# Patient Record
Sex: Female | Born: 1985 | Race: White | Hispanic: No | Marital: Married | Smoking: Never smoker
Health system: Southern US, Community
[De-identification: ages and names within clinical notes are randomized; demographics above are authoritative.]

## PROBLEM LIST (undated history)

## (undated) DIAGNOSIS — G4700 Insomnia, unspecified: Secondary | ICD-10-CM

---
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
## 2005-05-08 ENCOUNTER — Other Ambulatory Visit: Admission: RE | Admit: 2005-05-08 | Discharge: 2005-05-08 | Payer: Self-pay | Admitting: *Deleted

## 2005-06-18 ENCOUNTER — Encounter: Admission: RE | Admit: 2005-06-18 | Discharge: 2005-06-18 | Payer: Self-pay | Admitting: Family Medicine

## 2006-08-05 ENCOUNTER — Other Ambulatory Visit: Admission: RE | Admit: 2006-08-05 | Discharge: 2006-08-05 | Payer: Self-pay | Admitting: *Deleted

## 2007-09-03 ENCOUNTER — Inpatient Hospital Stay (HOSPITAL_COMMUNITY): Admission: RE | Admit: 2007-09-03 | Discharge: 2007-09-06 | Payer: Self-pay | Admitting: Obstetrics and Gynecology

## 2009-03-21 ENCOUNTER — Ambulatory Visit: Payer: Self-pay | Admitting: Psychiatry

## 2009-03-21 ENCOUNTER — Other Ambulatory Visit: Payer: Self-pay

## 2009-03-21 ENCOUNTER — Other Ambulatory Visit: Payer: Self-pay | Admitting: Emergency Medicine

## 2009-03-21 ENCOUNTER — Inpatient Hospital Stay (HOSPITAL_COMMUNITY): Admission: RE | Admit: 2009-03-21 | Discharge: 2009-03-24 | Payer: Self-pay | Admitting: Psychiatry

## 2009-07-19 ENCOUNTER — Emergency Department (HOSPITAL_BASED_OUTPATIENT_CLINIC_OR_DEPARTMENT_OTHER): Admission: EM | Admit: 2009-07-19 | Discharge: 2009-07-19 | Payer: Self-pay | Admitting: Emergency Medicine

## 2009-11-29 ENCOUNTER — Emergency Department (HOSPITAL_COMMUNITY): Admission: EM | Admit: 2009-11-29 | Discharge: 2009-11-29 | Payer: Self-pay | Admitting: Emergency Medicine

## 2010-10-09 LAB — URINE MICROSCOPIC-ADD ON

## 2010-10-09 LAB — BASIC METABOLIC PANEL
BUN: 9 mg/dL (ref 6–23)
CO2: 27 mEq/L (ref 19–32)
Calcium: 9.4 mg/dL (ref 8.4–10.5)
Chloride: 105 mEq/L (ref 96–112)
Creatinine, Ser: 0.74 mg/dL (ref 0.4–1.2)
GFR calc Af Amer: >60 mL/min (ref >60)
GFR calc non Af Amer: >60 mL/min (ref >60)
Glucose, Bld: 81 mg/dL (ref 70–99)
Potassium: 4.1 mEq/L (ref 3.5–5.1)
Sodium: 139 mEq/L (ref 135–145)

## 2010-10-09 LAB — URINALYSIS, ROUTINE W REFLEX MICROSCOPIC
Glucose, UA: NEGATIVE mg/dL
Ketones, ur: 40 mg/dL — AB
Nitrite: POSITIVE — AB
Protein, ur: 100 mg/dL — AB
Specific Gravity, Urine: 1.009 (ref 1.005–1.030)
Urobilinogen, UA: 1 mg/dL (ref 0.0–1.0)
pH: 6 (ref 5.0–8.0)

## 2010-10-09 LAB — DIFFERENTIAL
Basophils Absolute: 0.1 10*3/uL (ref 0.0–0.1)
Basophils Relative: 1 % (ref 0–1)
Neutro Abs: 11.8 10*3/uL — ABNORMAL HIGH (ref 1.7–7.7)
Neutrophils Relative %: 82 % — ABNORMAL HIGH (ref 43–77)

## 2010-10-09 LAB — CBC
MCHC: 33.8 g/dL (ref 30.0–36.0)
Platelets: 308 10*3/uL (ref 150–400)
RDW: 13 % (ref 11.5–15.5)

## 2010-10-09 LAB — GC/CHLAMYDIA PROBE AMP, GENITAL
Chlamydia, DNA Probe: NEGATIVE
GC Probe Amp, Genital: NEGATIVE

## 2010-10-22 LAB — URINALYSIS, ROUTINE W REFLEX MICROSCOPIC
Bilirubin Urine: NEGATIVE
Hgb urine dipstick: NEGATIVE
Nitrite: NEGATIVE
Specific Gravity, Urine: 1.015 (ref 1.005–1.030)
pH: 6.5 (ref 5.0–8.0)

## 2010-10-22 LAB — POCT TOXICOLOGY PANEL

## 2010-10-22 LAB — PREGNANCY, URINE: Preg Test, Ur: NEGATIVE

## 2010-10-22 LAB — BASIC METABOLIC PANEL
BUN: 11 mg/dL (ref 6–23)
Chloride: 104 mEq/L (ref 96–112)
Glucose, Bld: 105 mg/dL — ABNORMAL HIGH (ref 70–99)
Potassium: 4.3 mEq/L (ref 3.5–5.1)
Sodium: 143 mEq/L (ref 135–145)

## 2010-10-27 LAB — POCT I-STAT, CHEM 8
BUN: 12 mg/dL (ref 6–23)
Creatinine, Ser: 0.8 mg/dL (ref 0.4–1.2)
Glucose, Bld: 84 mg/dL (ref 70–99)
Hemoglobin: 13.9 g/dL (ref 12.0–15.0)
Sodium: 140 mEq/L (ref 135–145)
TCO2: 22 mmol/L (ref 0–100)

## 2010-10-27 LAB — DIFFERENTIAL
Eosinophils Absolute: 0 10*3/uL (ref 0.0–0.7)
Eosinophils Relative: 0 % (ref 0–5)
Lymphocytes Relative: 5 % — ABNORMAL LOW (ref 12–46)
Lymphs Abs: 0.5 10*3/uL — ABNORMAL LOW (ref 0.7–4.0)
Monocytes Relative: 3 % (ref 3–12)

## 2010-10-27 LAB — URINALYSIS, ROUTINE W REFLEX MICROSCOPIC
Glucose, UA: NEGATIVE mg/dL
Hgb urine dipstick: NEGATIVE
Protein, ur: NEGATIVE mg/dL
pH: 6.5 (ref 5.0–8.0)

## 2010-10-27 LAB — CBC
HCT: 39.3 % (ref 36.0–46.0)
Hemoglobin: 13.4 g/dL (ref 12.0–15.0)
MCV: 92.5 fL (ref 78.0–100.0)
RBC: 4.25 MIL/uL (ref 3.87–5.11)
WBC: 9.7 10*3/uL (ref 4.0–10.5)

## 2010-10-27 LAB — URINE MICROSCOPIC-ADD ON

## 2010-10-27 LAB — RAPID URINE DRUG SCREEN, HOSP PERFORMED
Barbiturates: NOT DETECTED
Benzodiazepines: POSITIVE — AB

## 2010-10-27 LAB — ETHANOL: Alcohol, Ethyl (B): <5 mg/dL (ref 0–10)

## 2010-12-04 NOTE — Op Note (Signed)
[name], [name]             ACCOUNT NO.:  [id number]   MEDICAL RECORD NO.:  [id number]          PATIENT TYPE:  INP   LOCATION:  9103                          FACILITY:  [facility]   PHYSICIAN:  Kendra H. Tenny Craw, MD     DATE OF BIRTH:  [birth year]   DATE OF PROCEDURE:  09/03/2007  DATE OF DISCHARGE:                               OPERATIVE REPORT   PREOPERATIVE DIAGNOSIS:  1. 39 and 5 week intrauterine pregnancy.  2. History of labioplasty with desire for a primary low transverse      cesarean section without trial of labor.   POSTOPERATIVE DIAGNOSIS:  1. 39 and 5 week intrauterine pregnancy.  2. History of labioplasty with desire for a primary low transverse      cesarean section without trial of labor.   PROCEDURE:  Primary low transverse cesarean section via Pfannenstiel's  skin incision.   SURGEON:  Freddrick March. Tenny Craw, M.D.   ASSISTANT:  Luvenia Redden, M.D.   ANESTHESIA:  Spinal.   OPERATIVE FINDINGS:  A vigorous female infant in the vertex presentation  weighing 8 pounds 13 ounces with Apgar scores of 9 and 9.  Normal  ovaries and tubes.  It was noted that the patient did have an arcuate  shaped uterus.   SPECIMENS:  Placenta for disposal.   ESTIMATED BLOOD LOSS:  600 mL.   COMPLICATIONS:  None.   PROCEDURE IN DETAIL:  [name] is a 25 year old G1, P0, who presents  today at 39 weeks 5 days for a primary elective cesarean section for a  history of labioplasty with a desire to not have a vaginal delivery.  The risks, benefits and alternatives of cesarean section were discussed  with the patient prior to today. Following the appropriate informed  consent, the patient was brought to the operating room where spinal  anesthesia was administered and found to be adequate.  She was placed in  the dorsal supine position with a leftward tilt, prepped and draped in  the normal sterile fashion.  The scalpel was used to make a Pfannenstiel  skin incision which was carried down  through the underlying layers of  soft tissue to the fascia.  The fascia was incised in the midline.  The  fascial incision was extended laterally with Mayo scissors.  The  superior aspect of the fascial incision was grasped with Kocher clamps  x2, tented up, and the underlying rectus muscle was dissected off  sharply with the electrocautery unit and the same procedure was repeated  on the inferior aspect of the fascial incision.  The rectus muscles were  then separated in the midline.  The abdominal peritoneum was identified,  tented up, and entered sharply with Metzenbaum scissors.  The incision  in the abdominal peritoneum was then extended inferiorly and superiorly  with good visualization of the bladder.  The bladder blade was inserted.  The vesicouterine peritoneum was identified, tented up, entered sharply  and the incision was extended laterally with Metzenbaum scissors and the  bladder flap was created digitally.  A scalpel was then used to make a  low transverse  incision on the uterus which was extended laterally with  blunt dissection.  The fetal vertex was then identified and brought up  to the uterine incision.  A Kiwi vacuum was then used to assist with  delivery of the infant's head.  This was in place for 10 seconds and  disengaged quickly. The infant's head was delivered followed by the  shoulders and body.  The infant cried vigorously on the operative field.  The infant was bulb suctioned on the operative field.  The cord was  clamped and cut and the infant was passed to the waiting pediatricians.  The placenta was then spontaneously delivered.  The uterus was  exteriorized, cleared of all clot and debris.  Upon inspection of the  uterus, it was noted to have an arcuate shape intrauterine and heart  shaped on the outside. The ovaries and tubes were normal.  The uterine  incision was repaired with #1 chromic in a running locked fashion with a  second imbricating layer.  A  figure-of-eight suture using 2-0 chromic on  an SH needle was used to achieve hemostasis on the right hand aspect of  the uterine incision.  The uterine incision was then found to be  hemostatic.  The uterus was returned to the abdominal cavity.  The  abdominal cavity was cleared of all clot and debris and the uterine  incision was reinspected and again found to be hemostatic.  The  abdominal peritoneum was then reapproximated with 2-0 Vicryl in a  running fashion, the fascia was closed with looped PDS in a running  fashion, and the skin was closed with a 4-0 Keith needle in a  subcuticular fashion and Steri-Strips were placed to reinforce this.  Prior to placement of the subcuticular stitch, a topical anesthetic was  poured into the incision because the patient was starting to develop  return of sensation in this area. Please refer to the anesthesia record  for the name of the topical anesthetic that was administered.      Freddrick March. Tenny Craw, MD  Electronically Signed     KHR/MEDQ  D:  09/03/2007  T:  09/04/2007  Job:  [id number]
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
## 2010-12-07 NOTE — Discharge Summary (Signed)
Kayla Roth, Kayla Roth NO.:  [id number]   MEDICAL RECORD NO.:  [id number]          PATIENT TYPE:  INP   LOCATION:  9103                          FACILITY:  [facility]   PHYSICIAN:  Malva Limes, M.D.    DATE OF BIRTH:  1985-12-30   DATE OF ADMISSION:  09/03/2007  DATE OF DISCHARGE:  09/06/2007                               DISCHARGE SUMMARY   FINAL DIAGNOSIS:  1. Intrauterine gestation at 39-5/7 weeks.  2. History of labioplasty with desire of a primary cesarean section      and declines trial of labor.   PROCEDURE:  Primary low transverse cesarean section.   SURGEON:  Dr. Waynard Reeds.   ASSISTANT:  Dr. Lodema Hong.   COMPLICATIONS:  None.   This 25 year old G1, P0, presents at term for a primary cesarean  section, secondary to a decline of trial of labor.  The patient's  antepartum course had been complicated by just being a single female  with a labioplasty in 2005.  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  Otherwise, the patient's antepartum course had been uncomplicated.  She  did have a negative group B strep culture obtained around 36 weeks.  She  is taken to the operating at this point on a September 03, 2007 by Dr.  Waynard Reeds, where a primary low transverse cesarean section was  performed with the delivery of an 8-pound, 13-ounce female infant with  Apgars of 9 and 9.  Upon delivery, it was noted that the patient did  have an arcuate-shaped uterus, but the procedure went without  complications, and the patient's postoperative course was benign without  any significant fevers.  She was felt ready for discharge on  postoperative day #3.  She was sent home on a regular diet, told to  decrease her activities, told to continue her vitamins, was given  Percocet 1 to 2 every 4 hours as needed for pain, was to follow up in  our office in 4 weeks.  Instructions and precautions were reviewed with  the patient.   LABS ON DISCHARGE:  The patient had a hemoglobin of 11.0, white blood  cell count of 13.1, platelets of 208,000.      Leilani Able, P.A.-C.    ______________________________  Malva Limes, M.D.    MB/MEDQ  D:  09/29/2007  T:  09/30/2007  Job:  [id number]

## 2011-02-21 ENCOUNTER — Other Ambulatory Visit: Payer: Self-pay | Admitting: Obstetrics and Gynecology

## 2011-04-12 LAB — CBC
HCT: 31 — ABNORMAL LOW
HCT: 38.6
Hemoglobin: 11 — ABNORMAL LOW
MCHC: 35
MCHC: 35.5
MCV: 89.8
Platelets: 208
Platelets: 262
RBC: 4.3
RDW: 13.9
WBC: 11.2 — ABNORMAL HIGH

## 2011-04-12 LAB — RPR: RPR Ser Ql: NONREACTIVE

## 2011-10-21 ENCOUNTER — Other Ambulatory Visit (HOSPITAL_COMMUNITY): Payer: Self-pay | Admitting: Obstetrics and Gynecology

## 2011-10-21 DIAGNOSIS — N739 Female pelvic inflammatory disease, unspecified: Secondary | ICD-10-CM

## 2011-10-25 ENCOUNTER — Ambulatory Visit (HOSPITAL_COMMUNITY)
Admission: RE | Admit: 2011-10-25 | Discharge: 2011-10-25 | Disposition: A | Discharge status: Home / Self Care | Payer: PRIVATE HEALTH INSURANCE | Source: Ambulatory Visit | Attending: Obstetrics and Gynecology | Admitting: Obstetrics and Gynecology

## 2011-10-25 DIAGNOSIS — N949 Unspecified condition associated with female genital organs and menstrual cycle: Secondary | ICD-10-CM | POA: Insufficient documentation

## 2011-10-25 DIAGNOSIS — N739 Female pelvic inflammatory disease, unspecified: Secondary | ICD-10-CM | POA: Insufficient documentation

## 2011-10-25 MED ORDER — IOHEXOL 300 MG/ML  SOLN: 20.0000 mL | Freq: Once | INTRAMUSCULAR | Status: AC | PRN

## 2011-11-15 ENCOUNTER — Other Ambulatory Visit (HOSPITAL_COMMUNITY): Payer: Self-pay | Admitting: Obstetrics and Gynecology

## 2011-11-15 DIAGNOSIS — N739 Female pelvic inflammatory disease, unspecified: Secondary | ICD-10-CM

## 2011-11-19 ENCOUNTER — Ambulatory Visit (HOSPITAL_COMMUNITY)
Admission: RE | Admit: 2011-11-19 | Discharge: 2011-11-19 | Disposition: A | Discharge status: Home / Self Care | Payer: PRIVATE HEALTH INSURANCE | Source: Ambulatory Visit | Attending: Obstetrics and Gynecology | Admitting: Obstetrics and Gynecology

## 2011-11-19 DIAGNOSIS — N739 Female pelvic inflammatory disease, unspecified: Secondary | ICD-10-CM

## 2011-11-19 DIAGNOSIS — N979 Female infertility, unspecified: Secondary | ICD-10-CM | POA: Insufficient documentation

## 2011-11-19 MED ORDER — IOHEXOL 300 MG/ML  SOLN: 7.0000 mL | Freq: Once | INTRAMUSCULAR | Status: AC | PRN

## 2011-11-20 ENCOUNTER — Ambulatory Visit (HOSPITAL_COMMUNITY): Admission: RE | Admit: 2011-11-20 | Payer: PRIVATE HEALTH INSURANCE | Source: Ambulatory Visit

## 2011-11-24 ENCOUNTER — Emergency Department (HOSPITAL_BASED_OUTPATIENT_CLINIC_OR_DEPARTMENT_OTHER)
Admission: EM | Admit: 2011-11-24 | Discharge: 2011-11-25 | Disposition: A | Discharge status: Home / Self Care | Payer: PRIVATE HEALTH INSURANCE | Attending: Emergency Medicine | Admitting: Emergency Medicine

## 2011-11-24 ENCOUNTER — Emergency Department (INDEPENDENT_AMBULATORY_CARE_PROVIDER_SITE_OTHER): Payer: PRIVATE HEALTH INSURANCE

## 2011-11-24 ENCOUNTER — Encounter (HOSPITAL_BASED_OUTPATIENT_CLINIC_OR_DEPARTMENT_OTHER): Payer: Self-pay | Admitting: *Deleted

## 2011-11-24 DIAGNOSIS — R51 Headache: Secondary | ICD-10-CM

## 2011-11-24 DIAGNOSIS — R55 Syncope and collapse: Secondary | ICD-10-CM

## 2011-11-24 DIAGNOSIS — J3489 Other specified disorders of nose and nasal sinuses: Secondary | ICD-10-CM

## 2011-11-24 HISTORY — DX: Insomnia, unspecified: G47.00

## 2011-11-24 LAB — PREGNANCY, URINE: Preg Test, Ur: NEGATIVE

## 2011-11-24 LAB — DIFFERENTIAL
Basophils Absolute: 0 K/uL (ref 0.0–0.1)
Basophils Relative: 0 % (ref 0–1)
Eosinophils Absolute: 0.2 10*3/uL (ref 0.0–0.7)
Eosinophils Relative: 4 % (ref 0–5)
Lymphocytes Relative: 47 % — ABNORMAL HIGH (ref 12–46)
Lymphs Abs: 3 10*3/uL (ref 0.7–4.0)
Monocytes Absolute: 0.5 10*3/uL (ref 0.1–1.0)
Monocytes Relative: 8 % (ref 3–12)
Neutro Abs: 2.6 K/uL (ref 1.7–7.7)
Neutrophils Relative %: 41 % — ABNORMAL LOW (ref 43–77)

## 2011-11-24 LAB — CBC
HCT: 36.4 % (ref 36.0–46.0)
Hemoglobin: 12.8 g/dL (ref 12.0–15.0)
MCH: 30.6 pg (ref 26.0–34.0)
MCHC: 35.2 g/dL (ref 30.0–36.0)
MCV: 87.1 fL (ref 78.0–100.0)
Platelets: 287 K/uL (ref 150–400)
RBC: 4.18 MIL/uL (ref 3.87–5.11)
RDW: 12.6 % (ref 11.5–15.5)
WBC: 6.3 K/uL (ref 4.0–10.5)

## 2011-11-24 NOTE — Discharge Instructions (Signed)
Syncope You have had a fainting (syncopal) spell. A fainting episode is a sudden, short-lived loss of consciousness. It results in complete recovery. It occurs because there has been a temporary shortage of oxygen and/or sugar (glucose) to the brain. CAUSES   Blood pressure pills and other medications that may lower blood pressure below normal. Sudden changes in posture (sudden standing).   Over-medication. Take your medications as directed.   Standing too long. This can cause blood to pool in the legs.   Seizure disorders.   Low blood sugar (hypoglycemia) of diabetes. This more commonly causes coma.   Bearing down to go to the bathroom. This can cause your blood pressure to rise suddenly. Your body compensates by making the blood pressure too low when you stop bearing down.   Hardening of the arteries where the brain temporarily does not receive enough blood.   Irregular heart beat and circulatory problems.   Fear, emotional distress, injury, sight of blood, or illness.  Your caregiver will send you home if the syncope was from non-worrisome causes (benign). Depending on your age and health, you may stay to be monitored and observed. If you return home, have someone stay with you if your caregiver feels that is desirable. It is very important to keep all follow-up referrals and appointments in order to properly manage this condition. This is a serious problem which can lead to serious illness and death if not carefully managed.  WARNING: Do not drive or operate machinery until your caregiver feels that it is safe for you to do so. SEEK IMMEDIATE MEDICAL CARE IF:   You have another fainting episode or faint while lying or sitting down. DO NOT DRIVE YOURSELF. Call 911 if no other help is available.   You have chest pain, are feeling sick to your stomach (nausea), vomiting or abdominal pain.   You have an irregular heartbeat or one that is very fast (pulse over 120 beats per minute).    You have a loss of feeling in some part of your body or lose movement in your arms or legs.   You have difficulty with speech, confusion, severe weakness, or visual problems.   You become sweaty and/or feel light headed.  Make sure you are rechecked as instructed. Document Released: 07/08/2005 Document Revised: 06/27/2011 Document Reviewed: 02/26/2007 [employer] Patient Information 2012 [employer]. 

## 2011-11-24 NOTE — ED Notes (Signed)
Pt presents to ED with migraine HA.  Pt has hx of same.  Pt states pain mainly located in neck and across shoulders.

## 2011-11-24 NOTE — ED Notes (Signed)
Pt states that she has been having frequent "migraines" states that last night she had a seizure and" just didn't want to come in " pt states that she only has a mild HA now in the back of her head pt is alert and oriented
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
## 2011-11-25 LAB — RAPID URINE DRUG SCREEN, HOSP PERFORMED
Amphetamines: NOT DETECTED
Barbiturates: NOT DETECTED
Benzodiazepines: NOT DETECTED
Cocaine: NOT DETECTED
Opiates: NOT DETECTED
Tetrahydrocannabinol: NOT DETECTED

## 2011-11-25 LAB — BASIC METABOLIC PANEL WITH GFR
CO2: 25 meq/L (ref 19–32)
Chloride: 106 meq/L (ref 96–112)
Potassium: 3.6 meq/L (ref 3.5–5.1)
Sodium: 141 meq/L (ref 135–145)

## 2011-11-25 LAB — BASIC METABOLIC PANEL
BUN: 11 mg/dL (ref 6–23)
Calcium: 9.5 mg/dL (ref 8.4–10.5)
Creatinine, Ser: 0.7 mg/dL (ref 0.50–1.10)
GFR calc Af Amer: >90 mL/min (ref >90)
GFR calc non Af Amer: >90 mL/min (ref >90)
Glucose, Bld: 122 mg/dL — ABNORMAL HIGH (ref 70–99)

## 2011-11-28 ENCOUNTER — Other Ambulatory Visit (HOSPITAL_COMMUNITY): Payer: Self-pay | Admitting: Obstetrics and Gynecology

## 2011-11-28 DIAGNOSIS — N971 Female infertility of tubal origin: Secondary | ICD-10-CM

## 2011-11-28 DIAGNOSIS — N979 Female infertility, unspecified: Secondary | ICD-10-CM

## 2011-12-06 ENCOUNTER — Ambulatory Visit (HOSPITAL_COMMUNITY)
Admission: RE | Admit: 2011-12-06 | Discharge: 2011-12-06 | Disposition: A | Discharge status: Home / Self Care | Payer: PRIVATE HEALTH INSURANCE | Source: Ambulatory Visit | Attending: Obstetrics and Gynecology | Admitting: Obstetrics and Gynecology

## 2011-12-06 DIAGNOSIS — N971 Female infertility of tubal origin: Secondary | ICD-10-CM | POA: Insufficient documentation

## 2011-12-06 DIAGNOSIS — N979 Female infertility, unspecified: Secondary | ICD-10-CM

## 2011-12-06 MED ORDER — IOHEXOL 300 MG/ML  SOLN
30.0000 mL | Freq: Once | INTRAMUSCULAR | Status: AC | PRN
  Administered 2011-12-06: 30 mL

## 2012-07-24 IMAGING — CT CT HEAD W/O CM
2 series · 16 of 30 positions shown, 18 images · non-contrast
Comparison: 06/18/2005

CLINICAL DATA: Headache.

CT HEAD WITHOUT CONTRAST
TECHNIQUE: Contiguous axial images were obtained from the base of
the skull through the vertex without contrast.

[Series 2: head 4.8 h37s · axial · 0.46mm/px · z∈[-168,-35]mm · 8 of 36 slices shown, 10 images]
[im 4/36  brain]
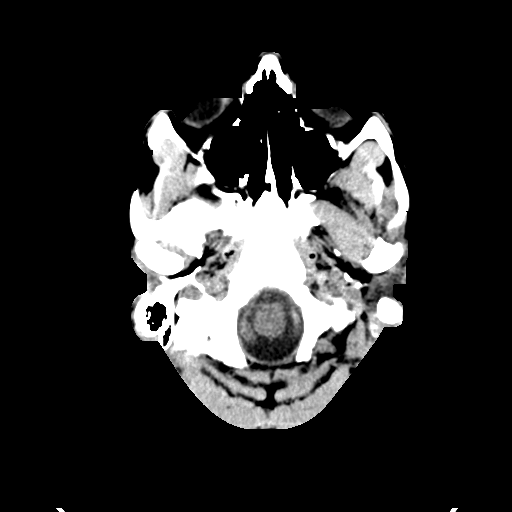
[im 4/36  bone]
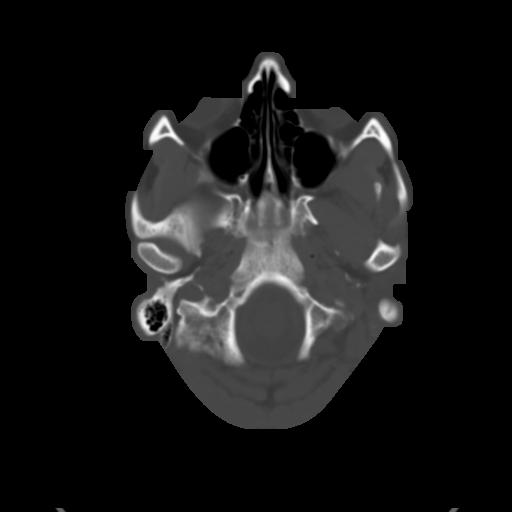
[im 8/36  brain]
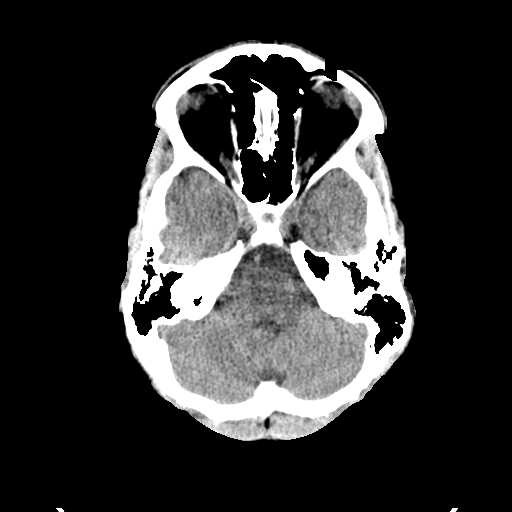
[im 12/36  brain]
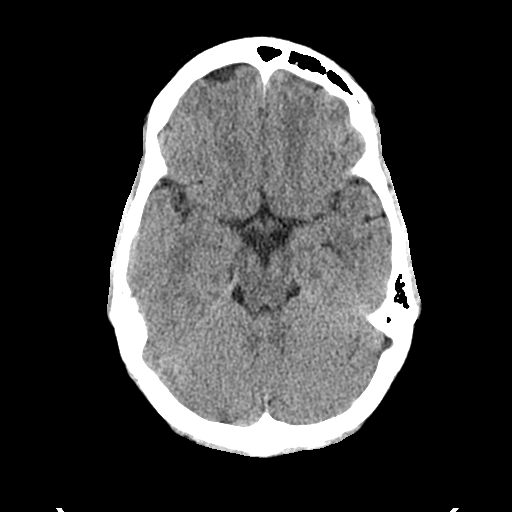
[im 16/36  brain]
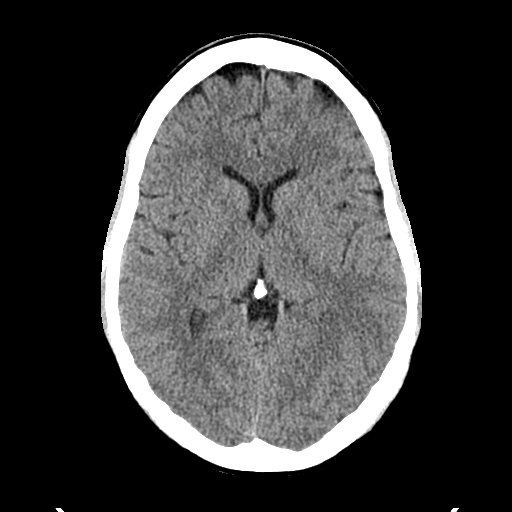
[im 20/36  brain]
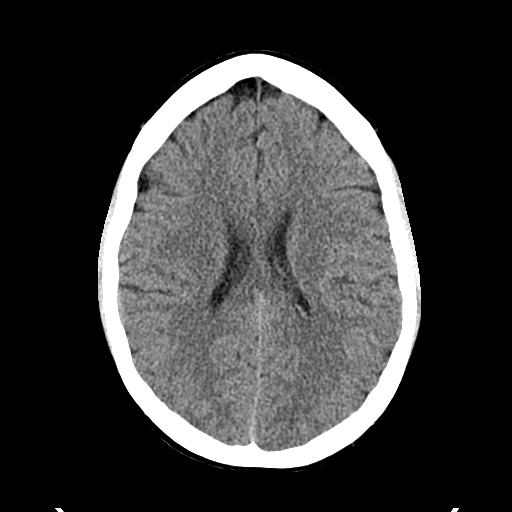
[im 20/36  bone]
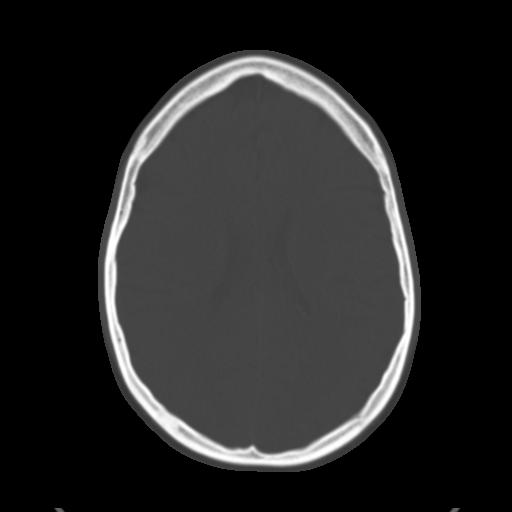
[im 24/36  brain]
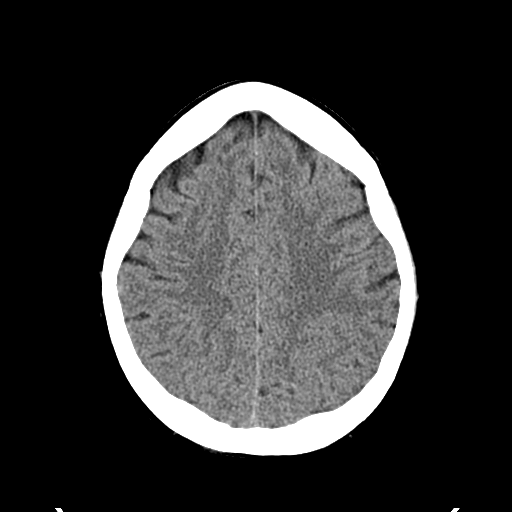
[im 28/36  brain]
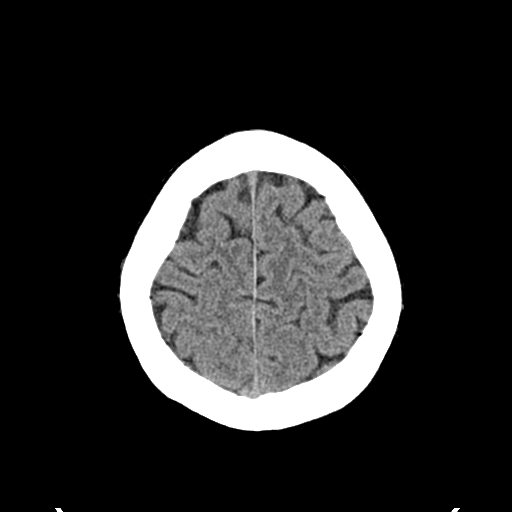
[im 32/36  brain]
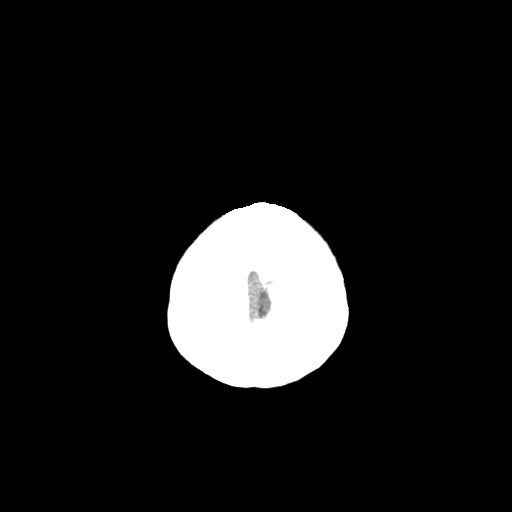

[Series 3: head 2.4 h60s bone · axial · 0.46mm/px · z∈[-166,-33]mm · 8 of 72 slices shown]
[im 8/72  bone]
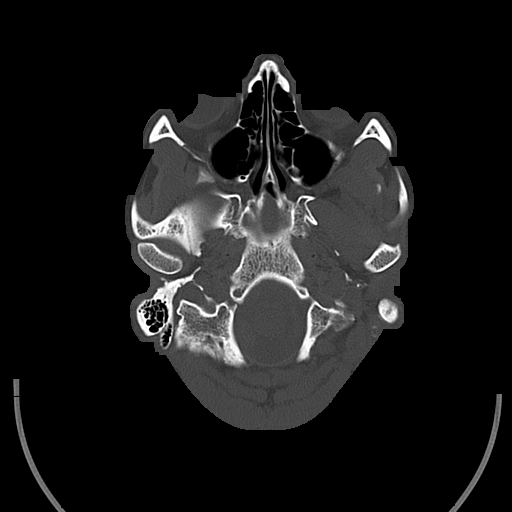
[im 15/72  bone]
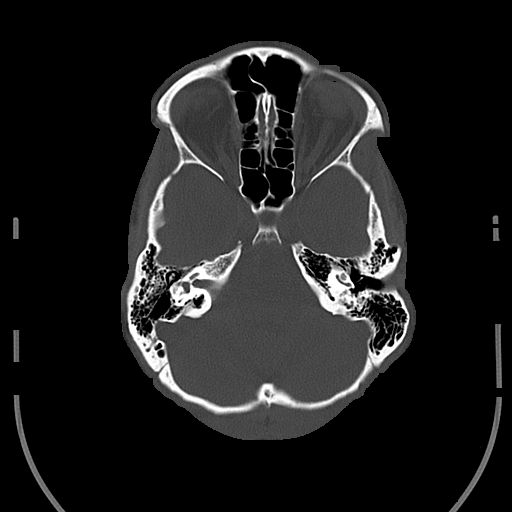
[im 23/72  bone]
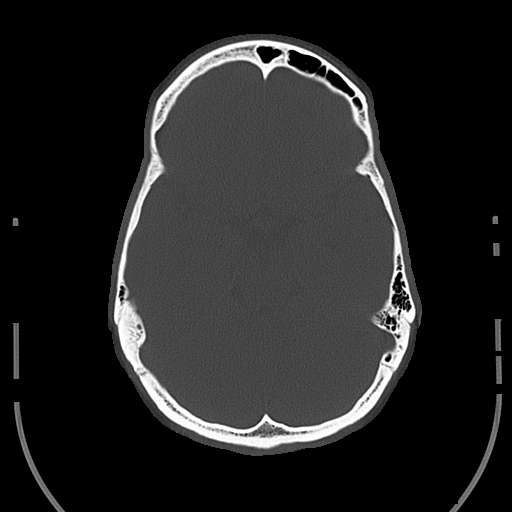
[im 30/72  bone]
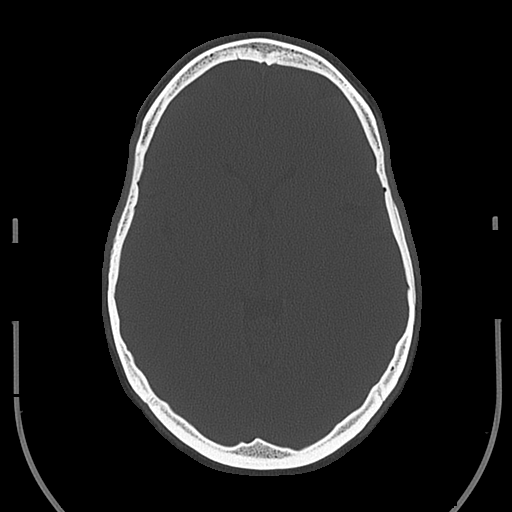
[im 42/72  bone]
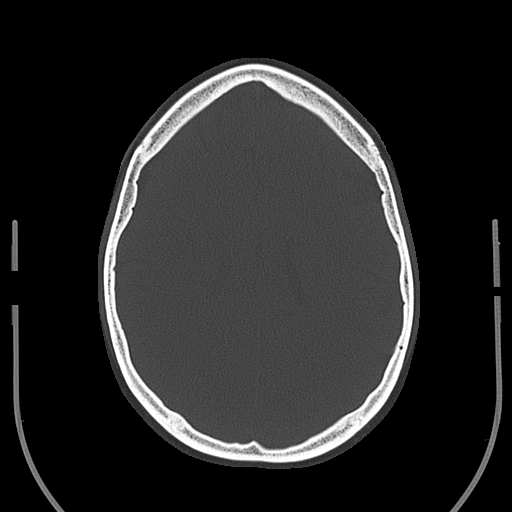
[im 49/72  bone]
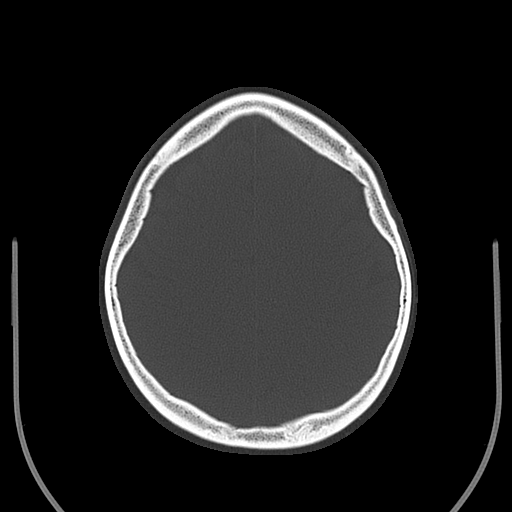
[im 57/72  bone]
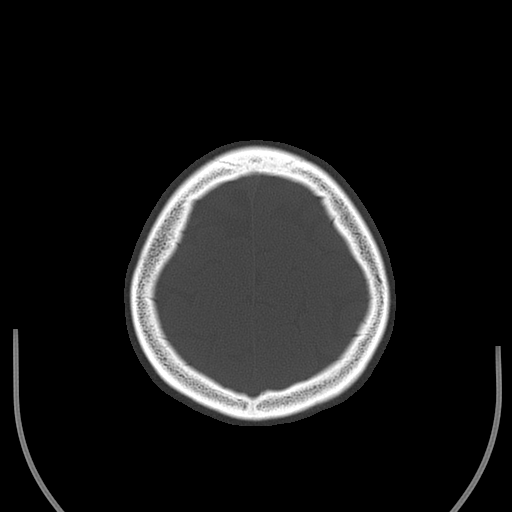
[im 64/72  bone]
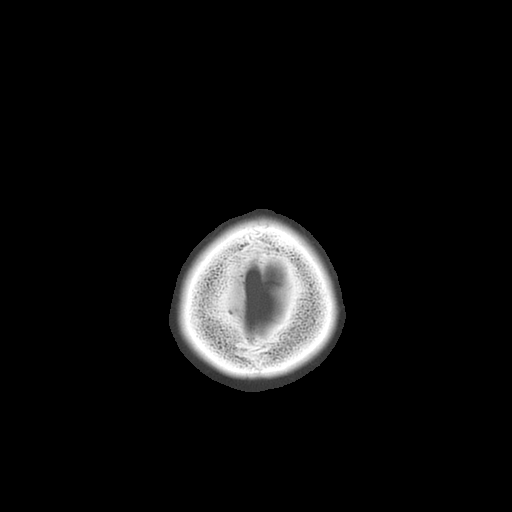

[16 of 30 positions shown; findings below may reference images not displayed]

FINDINGS: There is no evidence for acute hemorrhage, hydrocephalus,
mass lesion, or abnormal extra-axial fluid collection.  No definite
CT evidence for acute infarction.  Mucocele or mucosal polyp in the
left sphenoid sinus has progressed in the interval.  Remaining
visualized paranasal sinuses are clear.
IMPRESSION: No acute intracranial abnormality.

Chronic mucosal disease in the left sphenoid sinus.

## 2012-08-05 IMAGING — RF DG HYSTEROGRAM
9 series · 9 of 9 positions shown · non-contrast
Comparison: none

CLINICAL DATA: Infertility with tubal occlusion documented on
prior HSG.  Bilateral cannulation of the fallopian tubes to be
performed.

HYSTEROSALPINGOGRAM
TECHNIQUE: Hysterosalpingogram was performed by the ordering
physician under fluoroscopy.  Fluoroscopic images are submitted for
interpretation following the procedure.
Fluoroscopy Time:  4.3 minutes.

[Series 1: run · 1 of 1 slices shown (1 of 9)]
[im 1/1]
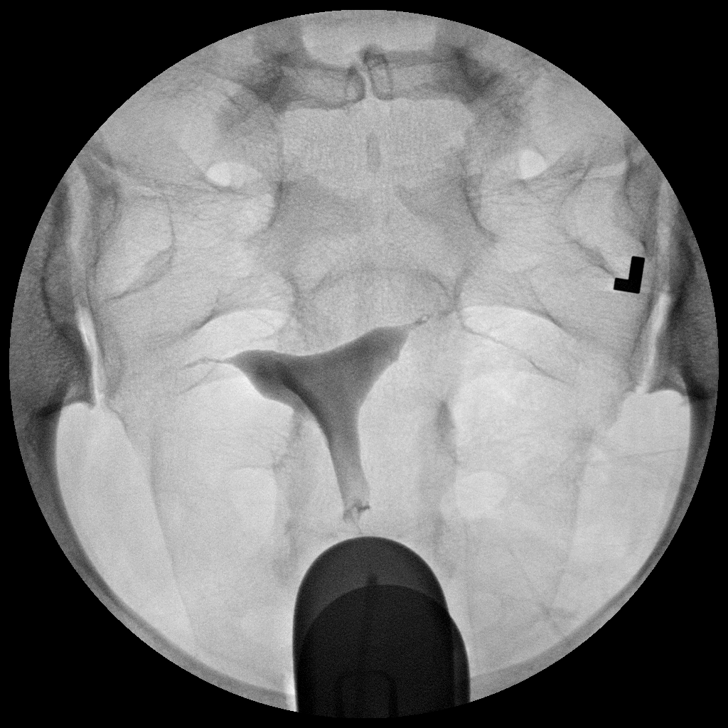

[Series 2: run · 1 of 1 slices shown (2 of 9)]
[im 1/1]
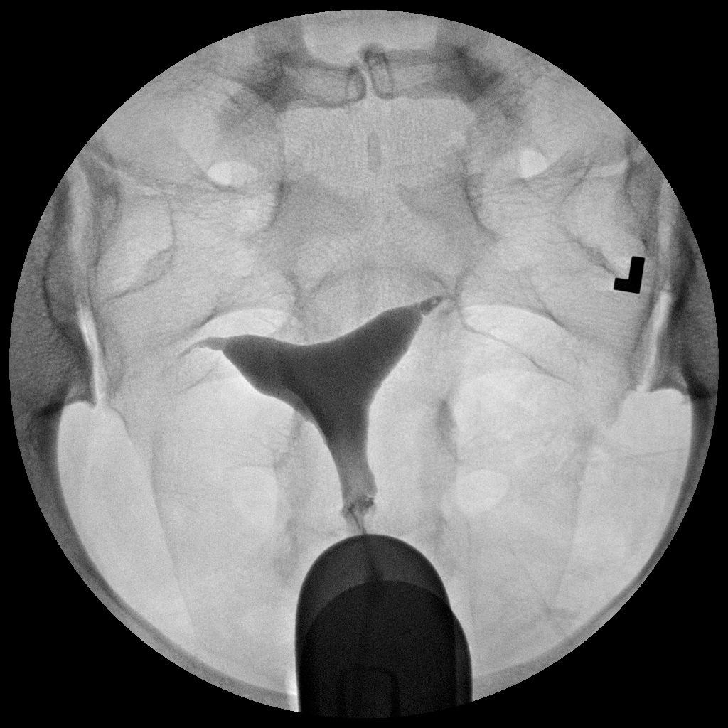

[Series 3: run · 1 of 1 slices shown (3 of 9)]
[im 1/1]
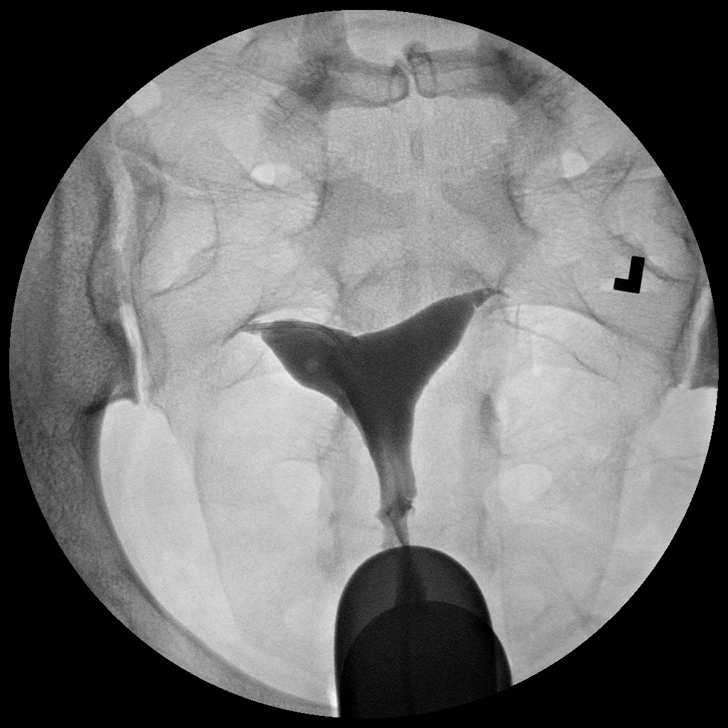

[Series 4: run · 1 of 1 slices shown (4 of 9)]
[im 1/1]
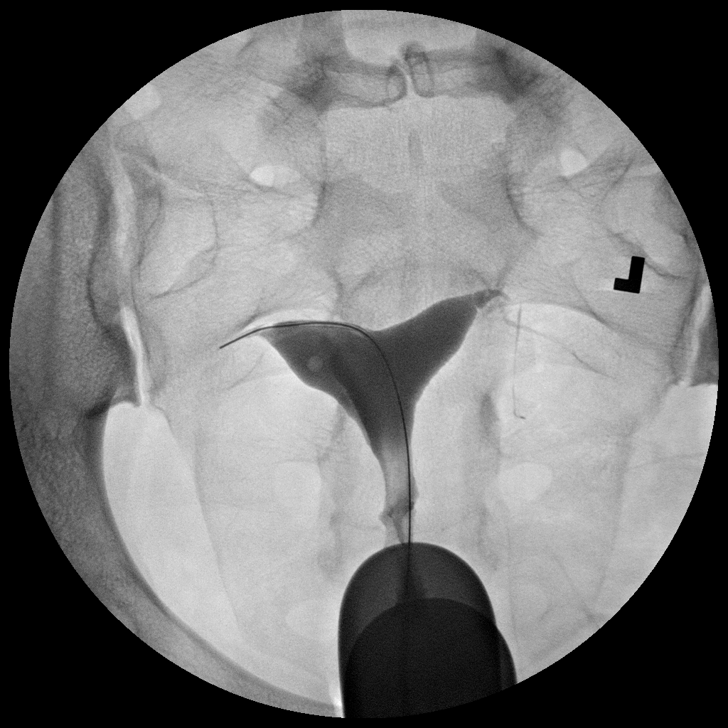

[Series 5: run · 1 of 1 slices shown (5 of 9)]
[im 1/1]
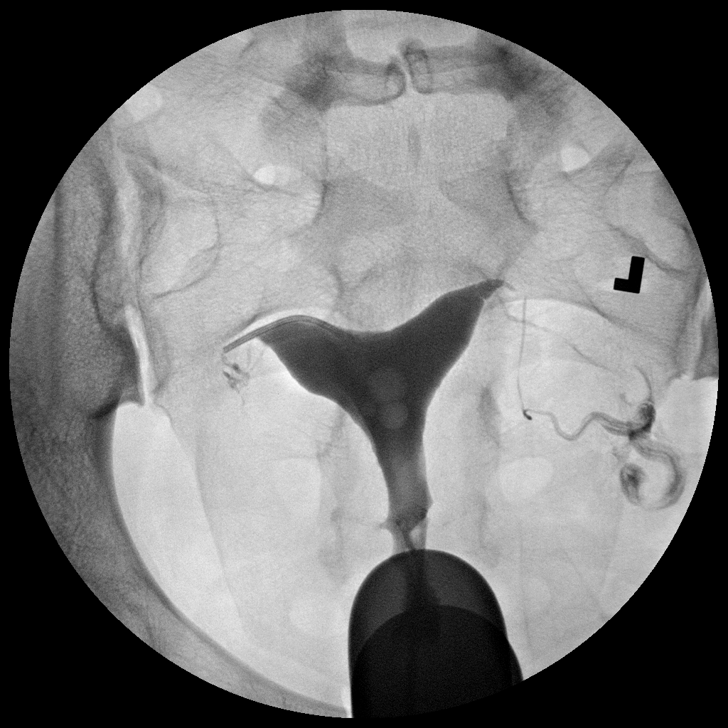

[Series 6: run · 1 of 1 slices shown (6 of 9)]
[im 1/1]
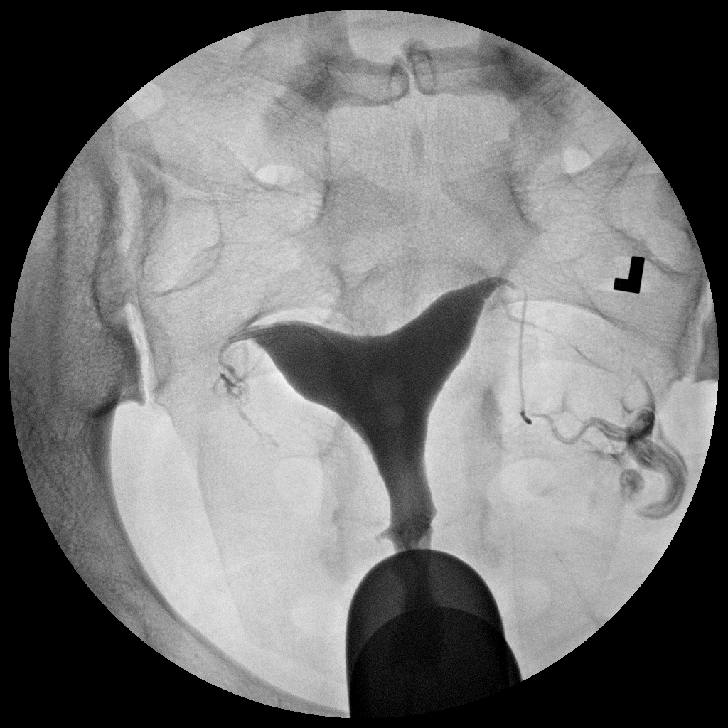

[Series 7: run · 1 of 1 slices shown (7 of 9)]
[im 1/1]
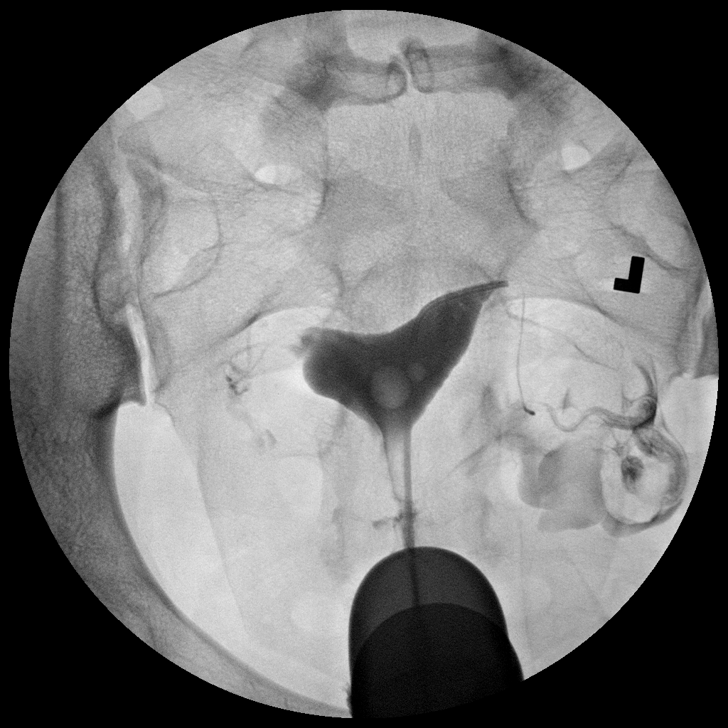

[Series 8: run · 1 of 1 slices shown (8 of 9)]
[im 1/1]
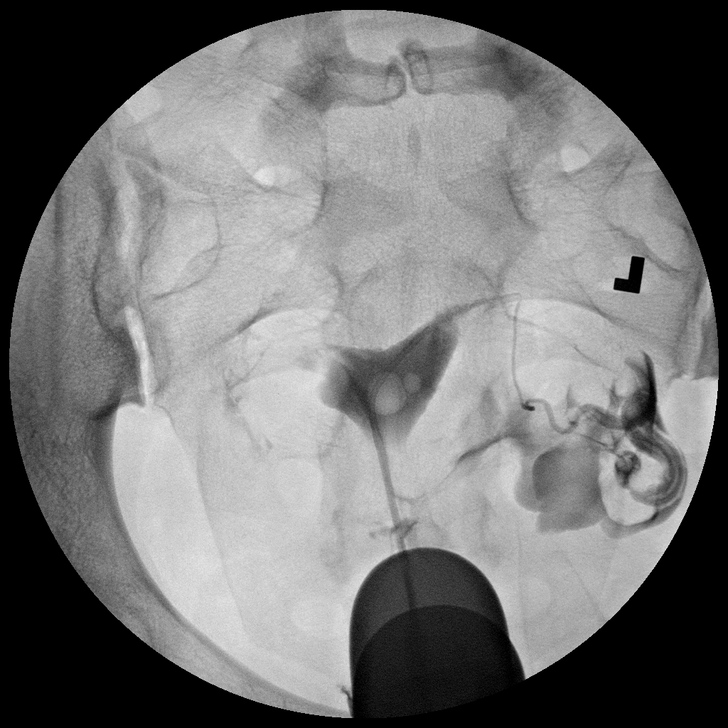

[Series 9: run · 1 of 1 slices shown (9 of 9)]
[im 1/1]
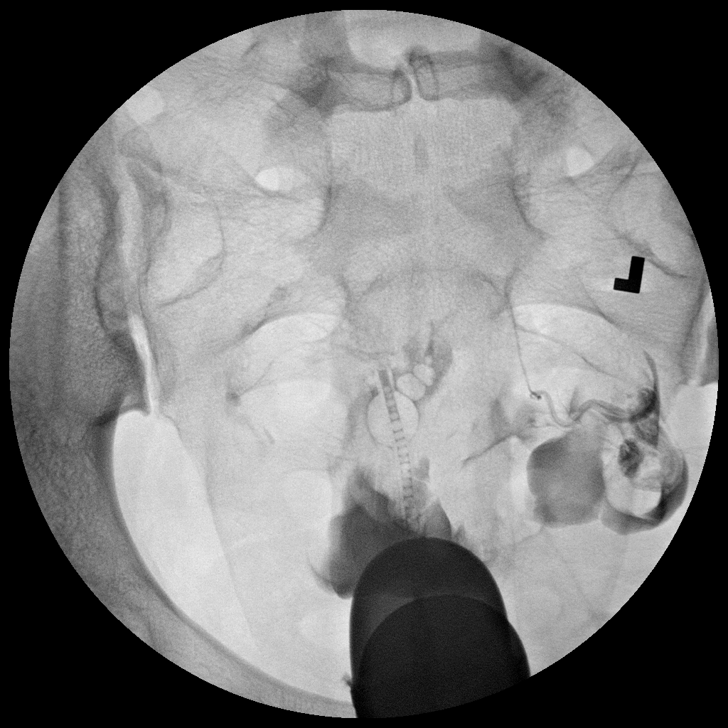

[9 of 9 positions shown; findings below may reference images not displayed]

FINDINGS: A normal endometrial morphology is noted.  Initial
injection again reveals the presence of bilateral tubal occlusion
at the level of the cornual-tubal junction.

Following cannulization, the left fallopian tube demonstrates a
normal morphology and intraperitoneal spill. Some loculation of
contrast is suggested medial to the fallopian tube on the left and
may signify the presence of peritubal adhesions. Some free
intraperitoneal spill is demonstrated as well.

Following cannulization on the right, there is persistent blockage
of the proximal portion of the fallopian tube as evidenced by the
presence of venous intravasation with selective tubal injection.
IMPRESSION: Post cannulization tubal patency confirmed on the left.
Intraperitoneal spill appearance suggests the possibility of some
peritubal adhesions as well as some free intraperitoneal spill.

Persistent tubal occlusion at the cornual tubal junction on the
right post cannulization.

## 2022-02-04 ENCOUNTER — Encounter: Payer: Self-pay | Admitting: Physician Assistant

## 2022-03-04 NOTE — Progress Notes (Deleted)
     03/04/2022 Kayla Roth [id number] [birth year]  Referring provider: No ref. provider found Primary GI doctor: Dr. Russella Dar  ASSESSMENT AND PLAN:   There are no diagnoses linked to this encounter.   No care team member to display  HISTORY OF PRESENT ILLNESS: 36 y.o. female presents for evaluation of rectal bleeding, AB pain.  12/07/2004 colonoscopy for hematochezia with Dr. Russella Dar, internal hemorrhoids. Unremarkable pathology  Current Medications:       Current Outpatient Medications (Hematological):    FOLIC ACID PO, Take 1 tablet by mouth daily.  Current Outpatient Medications (Other):    fish oil-omega-3 fatty acids 1000 MG capsule, Take 3 g by mouth daily.   Prenatal Vit-Fe Fumarate-FA (MULTIVITAMIN-PRENATAL) 27-0.8 MG TABS, Take 1 tablet by mouth daily.   QUEtiapine (SEROQUEL) 300 MG tablet, Take 300 mg by mouth at bedtime.   Riboflavin (VITAMIN B-2) 25 MG TABS, Take 1 tablet by mouth daily.  Medical History:  Past Medical History:  Diagnosis Date   Insomnia    Migraine    Allergies:  Allergies  Allergen Reactions   Doxycycline Hives   Morphine And Related Hives     Surgical History:  She  has a past surgical history that includes Cesarean section. Family History:  Her family history includes Anorexia nervosa in her mother; Bipolar disorder in her mother. Social History:   reports that she has never smoked. She does not have any smokeless tobacco history on file. She reports current alcohol use. She reports that she does not use drugs.  REVIEW OF SYSTEMS  : All other systems reviewed and negative except where noted in the History of Present Illness.   PHYSICAL EXAM: There were no vitals taken for this visit. General:   Pleasant, well developed female in no acute distress Head:   Normocephalic and atraumatic. Eyes:  {sclerae:26738},conjunctive {conjuctiva:26739}  Heart:   {HEART EXAM HEM/ONC:21750} Pulm:  Clear anteriorly; no wheezing Abdomen:    {BlankSingle:19197::"Distended","Ridged","Soft"}, {BlankSingle:19197::"Flat","Obese","Non-distended"} AB, {BlankSingle:19197::"Absent","Hyperactive, tinkling","Hypoactive","Sluggish","Active"} bowel sounds. {actendernessAB:27319} tenderness {anatomy; site abdomen:5010}. {BlankMultiple:19196::"Without guarding","With guarding","Without rebound","With rebound"}, No organomegaly appreciated. Rectal: {acrectalexam:27461} Extremities:  {With/Without:304960234} edema. Msk: Symmetrical without gross deformities. Peripheral pulses intact.  Neurologic:  Alert and  oriented x4;  No focal deficits.  Skin:   Dry and intact without significant lesions or rashes. Psychiatric:  Cooperative. Normal mood and affect.    Doree Albee, PA-C 10:19 AM

## 2022-03-05 ENCOUNTER — Telehealth: Payer: Self-pay | Admitting: Physician Assistant

## 2022-03-05 ENCOUNTER — Ambulatory Visit: Payer: PRIVATE HEALTH INSURANCE | Admitting: Physician Assistant

## 2022-03-05 DIAGNOSIS — K625 Hemorrhage of anus and rectum: Secondary | ICD-10-CM

## 2022-03-05 NOTE — Telephone Encounter (Signed)
LVM for patient to call back. Patient is able to do virtual with Marchelle Folks today at 2 or a virtual on another day if needed.

## 2022-03-05 NOTE — Telephone Encounter (Signed)
Good Morning Kayla Roth,  Patient called stating that she had an appointment with you today at 2:00 and wanted to see if she could change the appointment to a virtual due to her daughter being home from school sick. I advised patient that we could not do a virtual that she would need to reschedule for another day. Patient is having rectal bleeding and diarrhea. Patient wanted me to send a message and ask if she reschedules for another day if it can be virtual. I told her with the symptom that she is having she needed to be seen in the office. Patient demanded I send a message. Will you please advise on patient rescheduling her appointment?  Thank you!
